# Patient Record
Sex: Male | Born: 1995 | Race: White | Hispanic: No | Marital: Single | Smoking: Never smoker
Health system: Southern US, Community
[De-identification: ages and names within clinical notes are randomized; demographics above are authoritative.]

## PROBLEM LIST (undated history)

## (undated) HISTORY — PX: CORONARY ARTERY BYPASS GRAFT: SHX141

---

## 2014-11-22 ENCOUNTER — Ambulatory Visit (INDEPENDENT_AMBULATORY_CARE_PROVIDER_SITE_OTHER): Payer: BLUE CROSS/BLUE SHIELD | Admitting: Physician Assistant

## 2014-11-22 ENCOUNTER — Ambulatory Visit (INDEPENDENT_AMBULATORY_CARE_PROVIDER_SITE_OTHER): Payer: BLUE CROSS/BLUE SHIELD

## 2014-11-22 VITALS — BP 118/64 | HR 64 | Temp 97.9°F | Resp 16 | Ht 73.5 in | Wt 149.0 lb

## 2014-11-22 DIAGNOSIS — S99922A Unspecified injury of left foot, initial encounter: Secondary | ICD-10-CM

## 2014-11-22 NOTE — Patient Instructions (Signed)
We will continue to buddy tape this toe for the next 2-4 weeks, however this may not definitively solve this issue.  If this becomes painful, you need to let us know.
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
## 2018-12-08 ENCOUNTER — Other Ambulatory Visit: Payer: Self-pay

## 2018-12-08 DIAGNOSIS — Z20822 Contact with and (suspected) exposure to covid-19: Secondary | ICD-10-CM

## 2018-12-09 LAB — NOVEL CORONAVIRUS, NAA: SARS-CoV-2, NAA: DETECTED — AB

## 2018-12-10 ENCOUNTER — Telehealth: Payer: Self-pay | Admitting: Critical Care Medicine

## 2018-12-10 NOTE — Telephone Encounter (Signed)
I connected with the patient and informed him he is COVID positive from 12/08/2018.  The patient had lost his taste and smell the day of the testing and had slight headache the day before.  He did have exposure from a friend who was exposed to others at work.  He had was helping a friend move into his home when he was exposed and that was on 29 July.  The patient has no other symptoms.  I explained the patient needs to stay in isolation till August 15.  The health department may call him.  If he worsens he is to go to the emergency room.  He is to inform his work he is positive.  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

## 2018-12-22 ENCOUNTER — Other Ambulatory Visit: Payer: Self-pay

## 2018-12-22 DIAGNOSIS — Z20822 Contact with and (suspected) exposure to covid-19: Secondary | ICD-10-CM

## 2018-12-23 LAB — NOVEL CORONAVIRUS, NAA: SARS-CoV-2, NAA: NOT DETECTED

## 2019-01-26 ENCOUNTER — Other Ambulatory Visit: Payer: Self-pay

## 2019-01-26 DIAGNOSIS — Z20822 Contact with and (suspected) exposure to covid-19: Secondary | ICD-10-CM

## 2019-01-27 LAB — NOVEL CORONAVIRUS, NAA: SARS-CoV-2, NAA: NOT DETECTED

## 2019-04-13 ENCOUNTER — Other Ambulatory Visit: Payer: Self-pay

## 2019-04-13 DIAGNOSIS — Z20822 Contact with and (suspected) exposure to covid-19: Secondary | ICD-10-CM

## 2019-04-14 LAB — NOVEL CORONAVIRUS, NAA: SARS-CoV-2, NAA: NOT DETECTED

## 2019-04-25 ENCOUNTER — Other Ambulatory Visit: Payer: Self-pay | Admitting: Cardiology

## 2019-04-25 DIAGNOSIS — Z20822 Contact with and (suspected) exposure to covid-19: Secondary | ICD-10-CM

## 2019-04-26 LAB — NOVEL CORONAVIRUS, NAA: SARS-CoV-2, NAA: NOT DETECTED

## 2019-06-07 ENCOUNTER — Other Ambulatory Visit: Payer: Self-pay | Admitting: *Deleted

## 2019-06-07 DIAGNOSIS — Z20822 Contact with and (suspected) exposure to covid-19: Secondary | ICD-10-CM

## 2019-06-08 LAB — NOVEL CORONAVIRUS, NAA: SARS-CoV-2, NAA: NOT DETECTED
